# Patient Record
Sex: Female | Born: 1965 | Race: White | Hispanic: No
Health system: Southern US, Community
[De-identification: ages and names within clinical notes are randomized; demographics above are authoritative.]

## PROBLEM LIST (undated history)

## (undated) DIAGNOSIS — S069X9A Unspecified intracranial injury with loss of consciousness of unspecified duration, initial encounter: Secondary | ICD-10-CM

## (undated) DIAGNOSIS — S069XAA Unspecified intracranial injury with loss of consciousness status unknown, initial encounter: Secondary | ICD-10-CM

---

## 2019-09-07 ENCOUNTER — Other Ambulatory Visit: Payer: Self-pay | Admitting: Registered Nurse

## 2019-09-07 DIAGNOSIS — Z780 Asymptomatic menopausal state: Secondary | ICD-10-CM

## 2019-09-07 DIAGNOSIS — Z1231 Encounter for screening mammogram for malignant neoplasm of breast: Secondary | ICD-10-CM

## 2019-09-23 ENCOUNTER — Encounter (HOSPITAL_COMMUNITY): Payer: Self-pay | Admitting: Emergency Medicine

## 2019-09-23 ENCOUNTER — Other Ambulatory Visit: Payer: Self-pay

## 2019-09-23 ENCOUNTER — Emergency Department (HOSPITAL_COMMUNITY): Payer: Medicare Other

## 2019-09-23 ENCOUNTER — Emergency Department (HOSPITAL_COMMUNITY)
Admission: EM | Admit: 2019-09-23 | Discharge: 2019-09-23 | Disposition: A | Discharge status: Home / Self Care | Payer: Medicare Other | Attending: Emergency Medicine | Admitting: Emergency Medicine

## 2019-09-23 DIAGNOSIS — R569 Unspecified convulsions: Secondary | ICD-10-CM | POA: Insufficient documentation

## 2019-09-23 DIAGNOSIS — R410 Disorientation, unspecified: Secondary | ICD-10-CM | POA: Diagnosis not present

## 2019-09-23 HISTORY — DX: Unspecified intracranial injury with loss of consciousness status unknown, initial encounter: S06.9XAA

## 2019-09-23 HISTORY — DX: Unspecified intracranial injury with loss of consciousness of unspecified duration, initial encounter: S06.9X9A

## 2019-09-23 LAB — URINALYSIS, ROUTINE W REFLEX MICROSCOPIC
Bacteria, UA: NONE SEEN
Bilirubin Urine: NEGATIVE
Glucose, UA: NEGATIVE mg/dL
Ketones, ur: NEGATIVE mg/dL
Leukocytes,Ua: NEGATIVE
Nitrite: NEGATIVE
Protein, ur: NEGATIVE mg/dL
Specific Gravity, Urine: 1.009 (ref 1.005–1.030)
pH: 6 (ref 5.0–8.0)

## 2019-09-23 LAB — COMPREHENSIVE METABOLIC PANEL
ALT: 11 U/L (ref 0–44)
AST: 26 U/L (ref 15–41)
Albumin: 4 g/dL (ref 3.5–5.0)
Alkaline Phosphatase: 67 U/L (ref 38–126)
Anion gap: 11 (ref 5–15)
BUN: 7 mg/dL (ref 6–20)
CO2: 24 mmol/L (ref 22–32)
Calcium: 9.4 mg/dL (ref 8.9–10.3)
Chloride: 103 mmol/L (ref 98–111)
Creatinine, Ser: 0.74 mg/dL (ref 0.44–1.00)
GFR calc Af Amer: >60 mL/min (ref >60)
GFR calc non Af Amer: >60 mL/min (ref >60)
Glucose, Bld: 102 mg/dL — ABNORMAL HIGH (ref 70–99)
Potassium: 4.1 mmol/L (ref 3.5–5.1)
Sodium: 138 mmol/L (ref 135–145)
Total Bilirubin: 0.8 mg/dL (ref 0.3–1.2)
Total Protein: 7.5 g/dL (ref 6.5–8.1)

## 2019-09-23 LAB — CBC WITH DIFFERENTIAL/PLATELET
Abs Immature Granulocytes: 0.01 10*3/uL (ref 0.00–0.07)
Basophils Absolute: 0.1 10*3/uL (ref 0.0–0.1)
Basophils Relative: 1 %
Eosinophils Absolute: 0.3 10*3/uL (ref 0.0–0.5)
Eosinophils Relative: 4 %
HCT: 44.3 % (ref 36.0–46.0)
Hemoglobin: 14.5 g/dL (ref 12.0–15.0)
Immature Granulocytes: 0 %
Lymphocytes Relative: 28 %
Lymphs Abs: 2 10*3/uL (ref 0.7–4.0)
MCH: 29.6 pg (ref 26.0–34.0)
MCHC: 32.7 g/dL (ref 30.0–36.0)
MCV: 90.4 fL (ref 80.0–100.0)
Monocytes Absolute: 0.4 10*3/uL (ref 0.1–1.0)
Monocytes Relative: 6 %
Neutro Abs: 4.2 10*3/uL (ref 1.7–7.7)
Neutrophils Relative %: 61 %
Platelets: 350 10*3/uL (ref 150–400)
RBC: 4.9 MIL/uL (ref 3.87–5.11)
RDW: 12.1 % (ref 11.5–15.5)
WBC: 6.9 10*3/uL (ref 4.0–10.5)
nRBC: 0 % (ref 0.0–0.2)

## 2019-09-23 LAB — CBG MONITORING, ED: Glucose-Capillary: 95 mg/dL (ref 70–99)

## 2019-09-23 LAB — I-STAT BETA HCG BLOOD, ED (MC, WL, AP ONLY): I-stat hCG, quantitative: <5 m[IU]/mL (ref <5)

## 2019-09-23 LAB — ETHANOL: Alcohol, Ethyl (B): <10 mg/dL (ref <10)

## 2019-09-23 LAB — RAPID URINE DRUG SCREEN, HOSP PERFORMED
Amphetamines: NOT DETECTED
Barbiturates: NOT DETECTED
Benzodiazepines: NOT DETECTED
Cocaine: NOT DETECTED
Opiates: NOT DETECTED
Tetrahydrocannabinol: NOT DETECTED

## 2019-09-23 LAB — MAGNESIUM: Magnesium: 2.2 mg/dL (ref 1.7–2.4)

## 2019-09-23 MED ORDER — LEVETIRACETAM 500 MG PO TABS: 500.0000 mg | ORAL_TABLET | Freq: Two times a day (BID) | ORAL | 0 refills | Status: DC

## 2019-09-23 MED ORDER — LEVETIRACETAM 500 MG PO TABS
500.0000 mg | ORAL_TABLET | Freq: Once | ORAL | Status: AC
  Administered 2019-09-23: 500 mg via ORAL
  Filled 2019-09-23: qty 1

## 2019-09-23 NOTE — ED Triage Notes (Signed)
Pt here from home via [facility] for witnessed seizure that lasted approx 1 min w/ full body shaking, pt fell. 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 Pt is noncompliant with meds. Per EMS pt was combative at scene but has calmed down w/ talking. VSS.

## 2019-09-23 NOTE — ED Provider Notes (Signed)
[facility] EMERGENCY DEPARTMENT Provider Note   CSN: [id number] Arrival date & time: 09/23/19  1810     History Chief Complaint  Patient presents with  . Seizures    Susan Chen is a 54 y.o. female.  The history is provided by the EMS personnel. No language interpreter was used.   Susan Chen is a 54 y.o. female who presents to the Emergency Department complaining of seizure. Level V caveat due to confusion. History is provided by EMS. She presents the emergency department by EMS from home for evaluation following witness seizure that lasted about one minute. EMS reports that she was postictal and combative on their arrival. She did have urinary incontinence. She has a history of TBI and seizure disorder with three seizures in the last year. She is not currently on any medications due to medication noncompliance. She did have a fall associated with the seizure and struck her head.    Past Medical History:  Diagnosis Date  . TBI (traumatic brain injury) (HCC)     There are no problems to display for this patient.   History reviewed. No pertinent surgical history.   OB History   No obstetric history on file.     History reviewed. No pertinent family history.  Social History   Tobacco Use  . Smoking status: Not on file  Substance Use Topics  . Alcohol use: Not on file  . Drug use: Not on file    Home Medications Prior to Admission medications   Medication Sig Start Date End Date Taking? Authorizing Provider  levETIRAcetam (KEPPRA) 500 MG tablet Take 1 tablet (500 mg total) by mouth 2 (two) times daily. 09/23/19   Tilden Fossa, MD    Allergies    Penicillins and Sulfamethoxazole  Review of Systems   Review of Systems  All other systems reviewed and are negative.   Physical Exam Updated Vital Signs BP 138/69   Pulse 62   Temp 97.8 F (36.6 C) (Oral)   Resp 13   SpO2 100%   Physical Exam Vitals and nursing note reviewed.    Constitutional:      Appearance: She is well-developed.  HENT:     Head: Normocephalic and atraumatic.  Cardiovascular:     Rate and Rhythm: Normal rate and regular rhythm.  Pulmonary:     Effort: Pulmonary effort is normal. No respiratory distress.  Abdominal:     Palpations: Abdomen is soft.     Tenderness: There is no abdominal tenderness. There is no guarding or rebound.  Musculoskeletal:        General: No tenderness.  Skin:    General: Skin is warm and dry.  Neurological:     Mental Status: She is alert.     Comments: Confused. Minimally verbal. Sometimes answers yes/no questions but not consistently. Moves all extremities symmetrically and strongly.  Psychiatric:     Comments: Unable to assess     ED Results / Procedures / Treatments   Labs (all labs ordered are listed, but only abnormal results are displayed) Labs Reviewed  COMPREHENSIVE METABOLIC PANEL - Abnormal; Notable for the following components:      Result Value   Glucose, Bld 102 (*)    All other components within normal limits  URINALYSIS, ROUTINE W REFLEX MICROSCOPIC - Abnormal; Notable for the following components:   Hgb urine dipstick SMALL (*)    All other components within normal limits  ETHANOL  CBC WITH DIFFERENTIAL/PLATELET  RAPID URINE DRUG  SCREEN, HOSP PERFORMED  MAGNESIUM  I-STAT BETA HCG BLOOD, ED ([facility], [facility], [facility] ONLY)  CBG MONITORING, ED    EKG EKG Interpretation  Date/Time:  Wednesday September 23 2019 18:11:47 EDT Ventricular Rate:  96 PR Interval:    QRS Duration: 84 QT Interval:  385 QTC Calculation: 487 R Axis:   38 Text Interpretation: Sinus rhythm Prolonged PR interval Borderline prolonged QT interval No previous ECGs available Confirmed by Tilden Fossa [phone]) on 09/23/2019 6:12:40 PM   Radiology CT Head Wo Contrast  Result Date: 09/23/2019 CLINICAL DATA:  54 year old female with trauma. EXAM: CT HEAD WITHOUT CONTRAST CT CERVICAL SPINE WITHOUT CONTRAST TECHNIQUE:  Multidetector CT imaging of the head and cervical spine was performed following the standard protocol without intravenous contrast. Multiplanar CT image reconstructions of the cervical spine were also generated. COMPARISON:  None. FINDINGS: CT HEAD FINDINGS Brain: Mild age-related atrophy and chronic microvascular ischemic changes. There is no acute intracranial hemorrhage. No mass effect or midline shift. No extra-axial fluid collection. Vascular: No hyperdense vessel or unexpected calcification. Skull: Normal. Negative for fracture or focal lesion. Sinuses/Orbits: No acute finding. Other: None CT CERVICAL SPINE FINDINGS Alignment: No acute subluxation. Skull base and vertebrae: No acute fracture. Subcentimeter hypodense lesion in C6, indeterminate, likely a benign process such as a bone cyst. Soft tissues and spinal canal: No prevertebral fluid or swelling. No visible canal hematoma. Disc levels:  No acute findings.  Mild degenerative changes. Upper chest: Emphysema. Biapical subpleural scarring. Several nodular density in the right apical subpleural region measuring up to 8 mm, likely scarring. Follow-up with chest CT in 3 months recommended. Other: None IMPRESSION: 1. No acute intracranial pathology. Mild age-related atrophy and chronic microvascular ischemic changes. 2. No acute/traumatic cervical spine pathology. 3. Emphysema (ICD10-J43.9). Electronically Signed   By: Elgie Collard M.D.   On: 09/23/2019 19:59   CT Cervical Spine Wo Contrast  Result Date: 09/23/2019 CLINICAL DATA:  54 year old female with trauma. EXAM: CT HEAD WITHOUT CONTRAST CT CERVICAL SPINE WITHOUT CONTRAST TECHNIQUE: Multidetector CT imaging of the head and cervical spine was performed following the standard protocol without intravenous contrast. Multiplanar CT image reconstructions of the cervical spine were also generated. COMPARISON:  None. FINDINGS: CT HEAD FINDINGS Brain: Mild age-related atrophy and chronic microvascular  ischemic changes. There is no acute intracranial hemorrhage. No mass effect or midline shift. No extra-axial fluid collection. Vascular: No hyperdense vessel or unexpected calcification. Skull: Normal. Negative for fracture or focal lesion. Sinuses/Orbits: No acute finding. Other: None CT CERVICAL SPINE FINDINGS Alignment: No acute subluxation. Skull base and vertebrae: No acute fracture. Subcentimeter hypodense lesion in C6, indeterminate, likely a benign process such as a bone cyst. Soft tissues and spinal canal: No prevertebral fluid or swelling. No visible canal hematoma. Disc levels:  No acute findings.  Mild degenerative changes. Upper chest: Emphysema. Biapical subpleural scarring. Several nodular density in the right apical subpleural region measuring up to 8 mm, likely scarring. Follow-up with chest CT in 3 months recommended. Other: None IMPRESSION: 1. No acute intracranial pathology. Mild age-related atrophy and chronic microvascular ischemic changes. 2. No acute/traumatic cervical spine pathology. 3. Emphysema (ICD10-J43.9). Electronically Signed   By: Elgie Collard M.D.   On: 09/23/2019 19:59    Procedures Procedures (including critical care time)  Medications Ordered in ED Medications  levETIRAcetam (KEPPRA) tablet 500 mg (500 mg Oral Given 09/23/19 2247)    ED Course  I have reviewed the triage vital signs and the nursing notes.  Pertinent labs &  imaging results that were available during my care of the patient were reviewed by me and considered in my medical decision making (see chart for details).    MDM Rules/Calculators/A&P                         Patient with history of TBI and seizure disorder here for evaluation following seizure event. Patient initially postictal on evaluation. On repeat assessment she is awake and alert with no acute complaints. Labs without significant electrolyte abnormality. CT scans with no acute abnormality. Discussed with patient and husband home  care for seizure. Given this is a third event of seizure recommend starting seizure medications. Discussed importance of neurology follow-up as well seizure precautions such as not driving. Return precautions discussed.  Final Clinical Impression(s) / ED Diagnoses Final diagnoses:  Seizure (HCC)    Rx / DC Orders ED Discharge Orders         Ordered    Ambulatory referral to Neurology  Status:  Canceled     Reprint    Comments: An appointment is requested in approximately: 2 weeks   09/23/19 2212    Ambulatory referral to Neurology     Discontinue  Reprint    Comments: An appointment is requested in approximately: 2 weeks seizures   09/23/19 2232    levETIRAcetam (KEPPRA) 500 MG tablet  2 times daily     Discontinue  Reprint     09/23/19 2232           Tilden Fossa, MD 09/23/19 2327

## 2019-09-23 NOTE — ED Notes (Signed)
Pt's caregiver verbalized understanding of discharge instructions. Follow up care and prescriptions reviewed. Pt brought to lobby via wheelchair w/ caregiver.

## 2019-10-06 ENCOUNTER — Encounter: Payer: Self-pay | Admitting: Neurology

## 2019-10-11 ENCOUNTER — Emergency Department (HOSPITAL_COMMUNITY)
Admission: EM | Admit: 2019-10-11 | Discharge: 2019-10-11 | Disposition: A | Discharge status: Home / Self Care | Payer: Medicare Other | Attending: Emergency Medicine | Admitting: Emergency Medicine

## 2019-10-11 ENCOUNTER — Encounter (HOSPITAL_COMMUNITY): Payer: Self-pay

## 2019-10-11 DIAGNOSIS — R4182 Altered mental status, unspecified: Secondary | ICD-10-CM | POA: Diagnosis not present

## 2019-10-11 DIAGNOSIS — G40909 Epilepsy, unspecified, not intractable, without status epilepticus: Secondary | ICD-10-CM | POA: Insufficient documentation

## 2019-10-11 MED ORDER — LEVETIRACETAM 500 MG PO TABS: 500.0000 mg | ORAL_TABLET | Freq: Two times a day (BID) | ORAL | 3 refills | Status: AC

## 2019-10-11 MED ORDER — LEVETIRACETAM IN NACL 1000 MG/100ML IV SOLN
1000.0000 mg | Freq: Once | INTRAVENOUS | Status: AC
  Administered 2019-10-11: 1000 mg via INTRAVENOUS
  Filled 2019-10-11: qty 100

## 2019-10-11 NOTE — ED Notes (Addendum)
Assume care from EMS per EMS pt had a witness seizure by husband lasted for about 7-10 mins, During post ictal Phase ems reports pt became combative and physical restraints were used and 5mg  of midazolam was given by EMS. Ems Reports Pt is nonverbal with an hx of TBI about 8 years ago ans started having frequent seizure, Pt is non complaint on Keppra. Pt is place on the monitor x 3, VSS at this time, Seizure precautions are place

## 2019-10-11 NOTE — ED Provider Notes (Signed)
[facility] EMERGENCY DEPARTMENT Provider Note   CSN: [id number] Arrival date & time: 10/11/19  1647     History Chief Complaint  Patient presents with  . Seizures    Susan Chen is a 54 y.o. female.  HPI   54 y/o female - hx of TBI and seizures, not taking medicines, she has had w/u in the past, CT done on 09/23/19, was neg for acute findings - she was Rx Keppra but has not filled it.  She had 2 witnessed seizures today, when paramedics were called the patient was postictal and starting to become a little bit agitated and combative. n Level 5 caveat applies due to altered MS.  Had received 5mg  of Versed by EMS for agitation pre hospital.  Per sig other - "almost non verbal".  Last seizure was around 2 weeks ago.  Past Medical History:  Diagnosis Date  . TBI (traumatic brain injury) (HCC)     There are no problems to display for this patient.   History reviewed. No pertinent surgical history.   OB History   No obstetric history on file.     History reviewed. No pertinent family history.  Social History   Tobacco Use  . Smoking status: Not on file  Substance Use Topics  . Alcohol use: Not on file  . Drug use: Not on file    Home Medications Prior to Admission medications   Medication Sig Start Date End Date Taking? Authorizing Provider  levETIRAcetam (KEPPRA) 500 MG tablet Take 1 tablet (500 mg total) by mouth 2 (two) times daily. 10/11/19   12/11/19, MD    Allergies    Penicillins and Sulfamethoxazole  Review of Systems   Review of Systems  Unable to perform ROS: Mental status change    Physical Exam Updated Vital Signs BP 102/70 (BP Location: Right Arm)   Pulse 89   Temp 98.7 F (37.1 C) (Oral)   Resp 19   Ht 1.626 m (5\' 4" )   Wt 45.7 kg   SpO2 96%   BMI 17.29 kg/m   Physical Exam Vitals and nursing note reviewed.  Constitutional:      General: She is not in acute distress.    Appearance: She is well-developed.    HENT:     Head: Normocephalic and atraumatic.     Mouth/Throat:     Pharynx: No oropharyngeal exudate.  Eyes:     General: No scleral icterus.       Right eye: No discharge.        Left eye: No discharge.     Conjunctiva/sclera: Conjunctivae normal.     Pupils: Pupils are equal, round, and reactive to light.  Neck:     Thyroid: No thyromegaly.     Vascular: No JVD.  Cardiovascular:     Rate and Rhythm: Normal rate and regular rhythm.     Heart sounds: Normal heart sounds. No murmur heard.  No friction rub. No gallop.   Pulmonary:     Effort: Pulmonary effort is normal. No respiratory distress.     Breath sounds: Normal breath sounds. No wheezing or rales.  Abdominal:     General: Bowel sounds are normal. There is no distension.     Palpations: Abdomen is soft. There is no mass.     Tenderness: There is no abdominal tenderness.  Musculoskeletal:        General: No tenderness. Normal range of motion.     Cervical back: Normal range  of motion and neck supple.  Lymphadenopathy:     Cervical: No cervical adenopathy.  Skin:    General: Skin is warm and dry.     Findings: No erythema or rash.  Neurological:     Mental Status: She is alert.     Coordination: Coordination normal.     Comments: Somnolent, seems to be moving all 4 extremities, nonverbal "nonverbal at baseline"  Psychiatric:        Behavior: Behavior normal.     ED Results / Procedures / Treatments   Labs (all labs ordered are listed, but only abnormal results are displayed) Labs Reviewed  BASIC METABOLIC PANEL  CBC    EKG EKG Interpretation  Date/Time:  Sunday October 11 2019 16:55:58 EDT Ventricular Rate:  94 PR Interval:    QRS Duration: 88 QT Interval:  372 QTC Calculation: 466 R Axis:   57 Text Interpretation: Sinus rhythm Consider left ventricular hypertrophy ECG OTHERWISE WITHIN NORMAL LIMITS Confirmed by Clariza Sickman ([id number]) on 10/11/2019 5:28:08 PM   Radiology No results  found.  Procedures Procedures (including critical care time)  Medications Ordered in ED Medications  levETIRAcetam (KEPPRA) IVPB 1000 mg/100 mL premix (1,000 mg Intravenous New Bag/Given 10/11/19 1901)    ED Course  I have reviewed the triage vital signs and the nursing notes.  Pertinent labs & imaging results that were available during my care of the patient were reviewed by me and considered in my medical decision making (see chart for details).    MDM Rules/Calculators/A&P                          This patient seems to be postictal, her vital signs are normal, she has known history of seizures, recent CT scan was unremarkable, I do not see a need to reCT scan her today.  She will need Keppra intravenously, observation until she is back to baseline, discharged to follow-up in the outpatient setting, awaiting family  According to family - believes that she had new onset seizures in August (1 month or so ago).  Final Clinical Impression(s) / ED Diagnoses Final diagnoses:  Seizure disorder (HCC)    Rx / DC Orders ED Discharge Orders         Ordered    levETIRAcetam (KEPPRA) 500 MG tablet  2 times daily        09 /05/21 1900           12-11-1984, MD 10/11/19 1902

## 2019-10-11 NOTE — ED Notes (Signed)
Pt d/c by MD given d/c instructions and follow up care, Pt is out of the ED ambulatory w/ family

## 2019-10-11 NOTE — Discharge Instructions (Signed)
Please take Keppra twice daily for the next 30 days, I have placed 2 refills on this prescription as well so that you can continue to take it until you see the neurologist.  If you need a family doctor please see the list above, but return to the emergency department for severe or worsening symptoms

## 2020-01-26 ENCOUNTER — Ambulatory Visit: Payer: Medicare Other | Admitting: Neurology

## 2020-04-18 ENCOUNTER — Ambulatory Visit: Payer: Medicare Other | Admitting: Neurology

## 2020-07-25 ENCOUNTER — Ambulatory Visit: Payer: Medicare HMO | Admitting: Neurology

## 2022-02-02 IMAGING — CT CT HEAD W/O CM
4 series · 15 of 47 positions shown, 17 images · non-contrast
Comparison: None.

CLINICAL DATA: 54-year-old female with trauma.

EXAM:
CT HEAD WITHOUT CONTRAST
CT CERVICAL SPINE WITHOUT CONTRAST
TECHNIQUE: Multidetector CT imaging of the head and cervical spine was
performed following the standard protocol without intravenous
contrast. Multiplanar CT image reconstructions of the cervical spine
were also generated.

[Series 3: head without · axial · non-contrast · 0.41mm/px · z∈[-99,+11]mm · 7 of 30 slices shown, 9 images]
[im 4/30  brain]
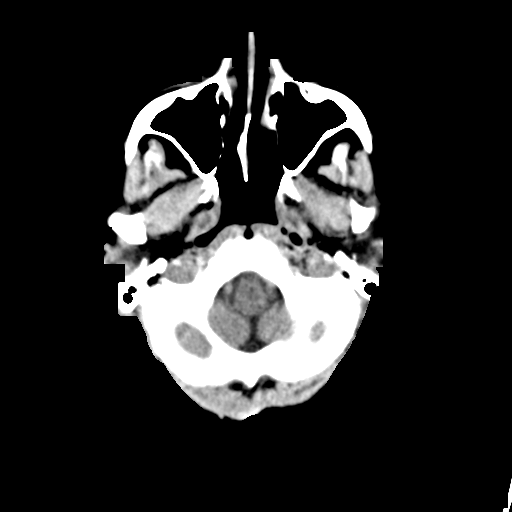
[im 4/30  bone]
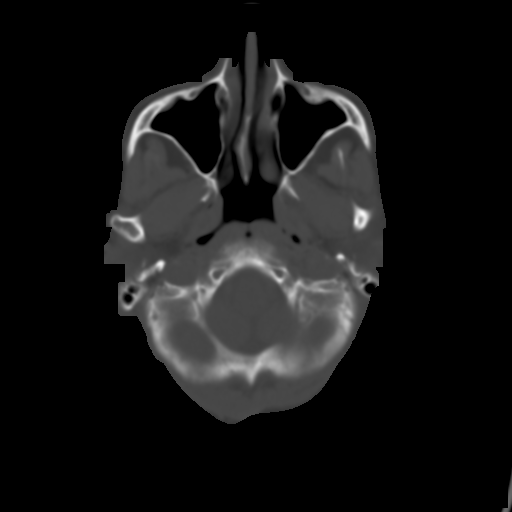
[im 8/30  brain]
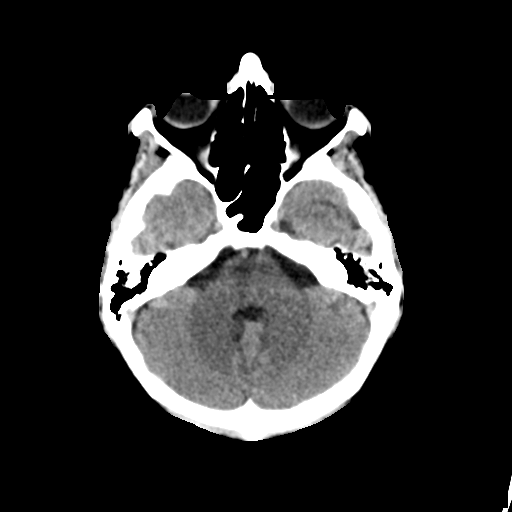
[im 11/30  brain]
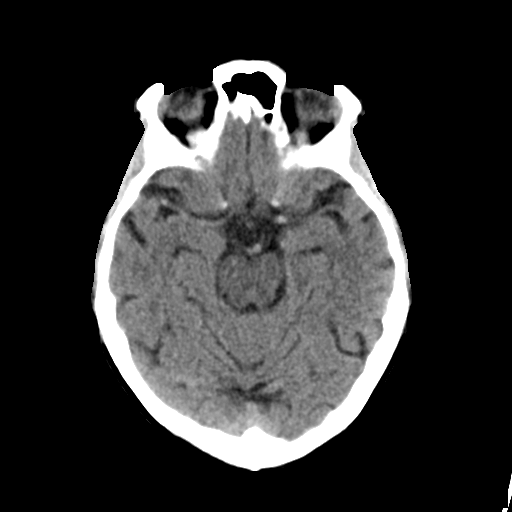
[im 15/30  brain]
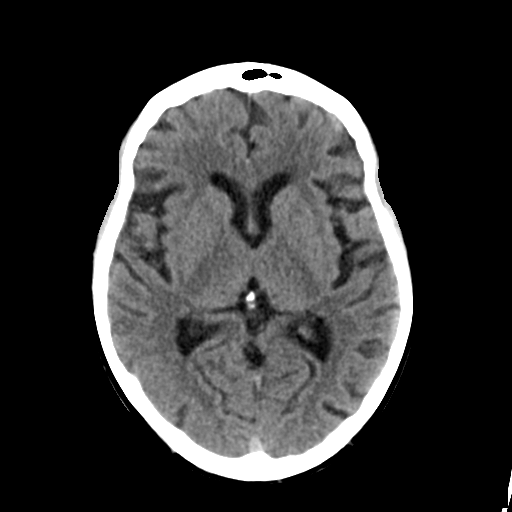
[im 19/30  brain]
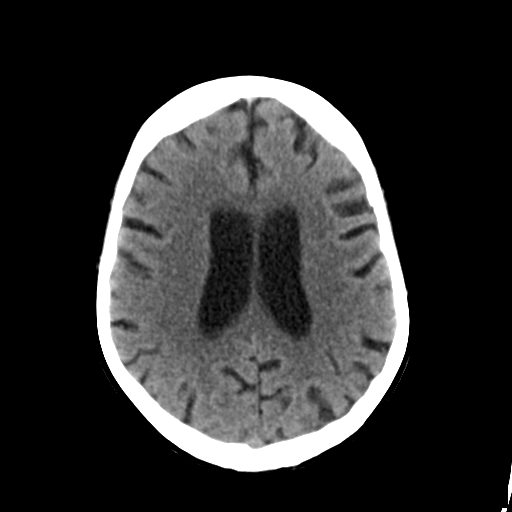
[im 19/30  bone]
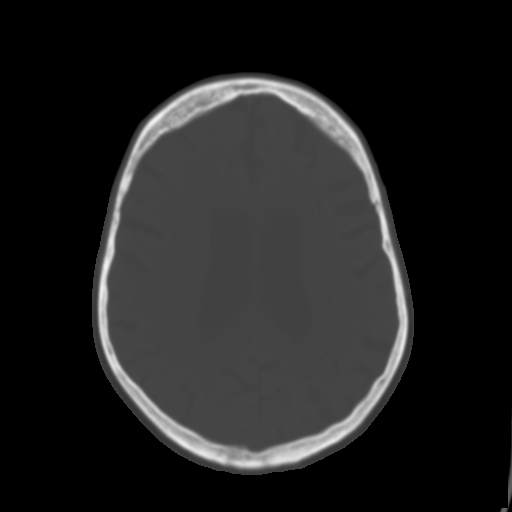
[im 22/30  brain]
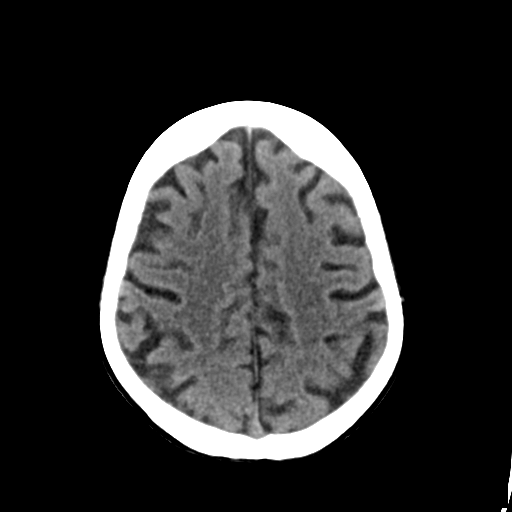
[im 26/30  brain]
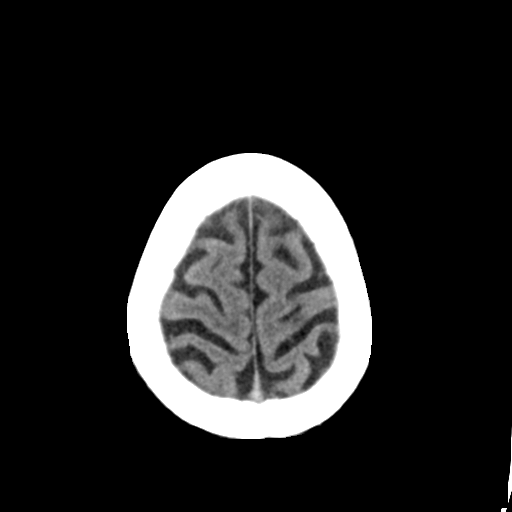

[Series 4: head bone · axial · 0.42mm/px · z∈[-100,-86]mm · 2 of 73 slices shown]
[im 8/73  bone]
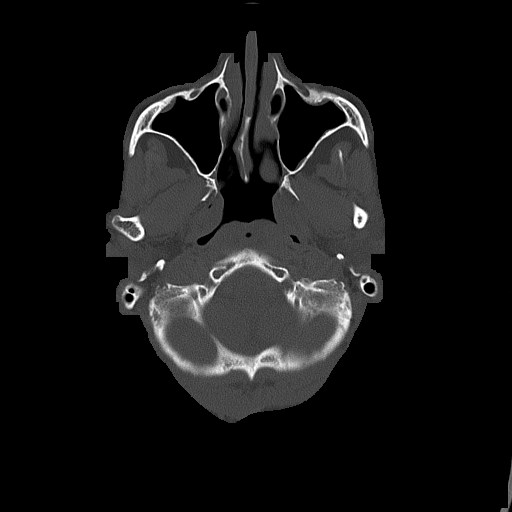
[im 15/73  bone]
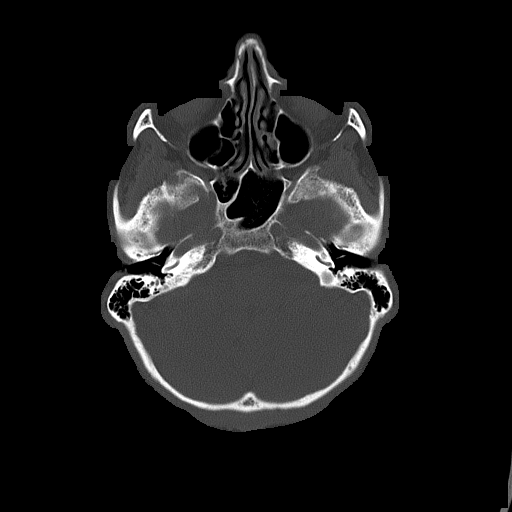

[Series 5: head without cor · coronal · non-contrast · 0.32mm/px · 3 of 69 slices shown]
[im 23/69  brain]
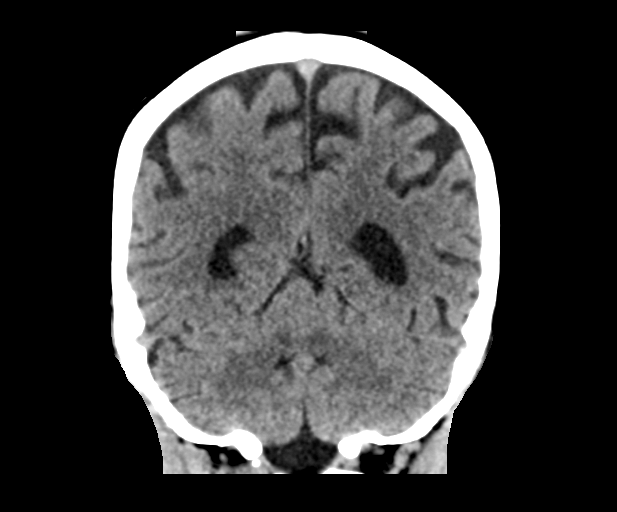
[im 31/69  brain]
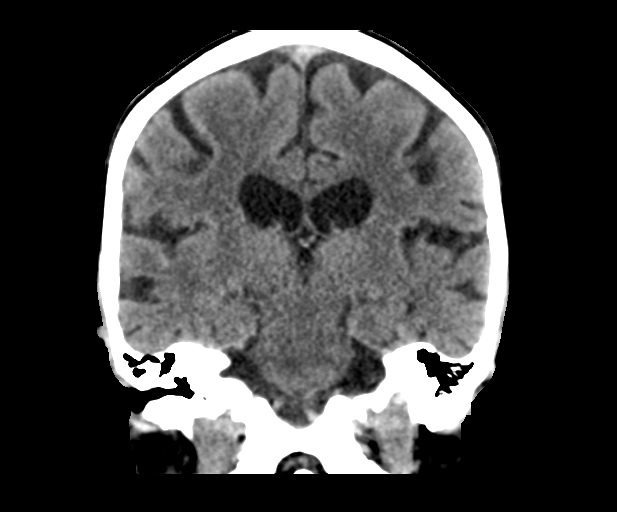
[im 38/69  brain]
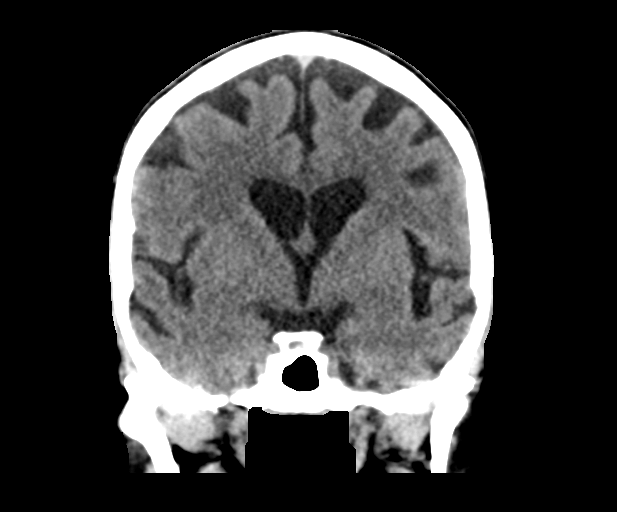

[Series 6: head without sag · sagittal · non-contrast · 0.30mm/px · 3 of 67 slices shown]
[im 23/67  brain]
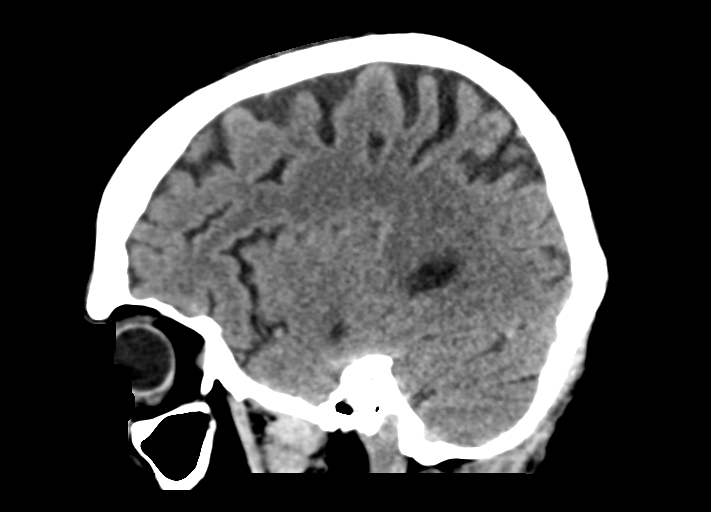
[im 34/67  brain]
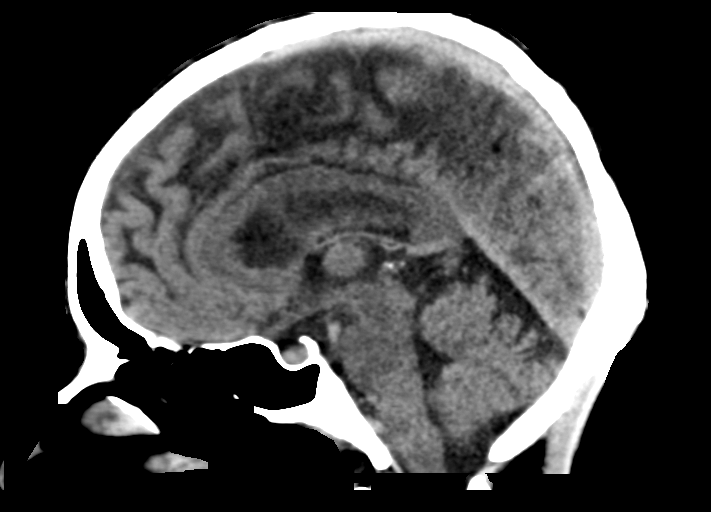
[im 45/67  brain]
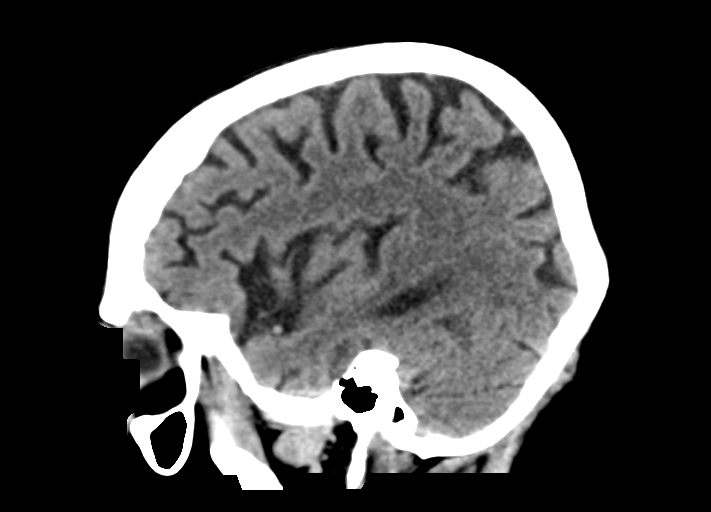

[15 of 47 positions shown; findings below may reference images not displayed]

FINDINGS: CT HEAD FINDINGS

Brain: Mild age-related atrophy and chronic microvascular ischemic
changes. There is no acute intracranial hemorrhage. No mass effect
or midline shift. No extra-axial fluid collection.

Vascular: No hyperdense vessel or unexpected calcification.

Skull: Normal. Negative for fracture or focal lesion.

Sinuses/Orbits: No acute finding.

Other: None

CT CERVICAL SPINE FINDINGS

Alignment: No acute subluxation.

Skull base and vertebrae: No acute fracture. Subcentimeter hypodense
lesion in C6, indeterminate, likely a benign process such as a bone
cyst.

Soft tissues and spinal canal: No prevertebral fluid or swelling. No
visible canal hematoma.

Disc levels:  No acute findings.  Mild degenerative changes.

Upper chest: Emphysema. Biapical subpleural scarring. Several
nodular density in the right apical subpleural region measuring up
to 8 mm, likely scarring. Follow-up with chest CT in 3 months
recommended.

Other: None
IMPRESSION: 1. No acute intracranial pathology. Mild age-related atrophy and
chronic microvascular ischemic changes.
2. No acute/traumatic cervical spine pathology.
3. Emphysema ([id number]).

## 2022-02-02 IMAGING — CT CT CERVICAL SPINE W/O CM
3 of 4 series · 13 of 33 positions shown, 16 images · non-contrast
Comparison: None.

CLINICAL DATA: 54-year-old female with trauma.

EXAM:
CT HEAD WITHOUT CONTRAST
CT CERVICAL SPINE WITHOUT CONTRAST
TECHNIQUE: Multidetector CT imaging of the head and cervical spine was
performed following the standard protocol without intravenous
contrast. Multiplanar CT image reconstructions of the cervical spine
were also generated.

[Series 4: c_spine 2.0 st · axial · 0.32mm/px · z∈[-274,-130]mm · 5 of 108 slices shown, 7 images]
[im 18/108  soft-tissue]
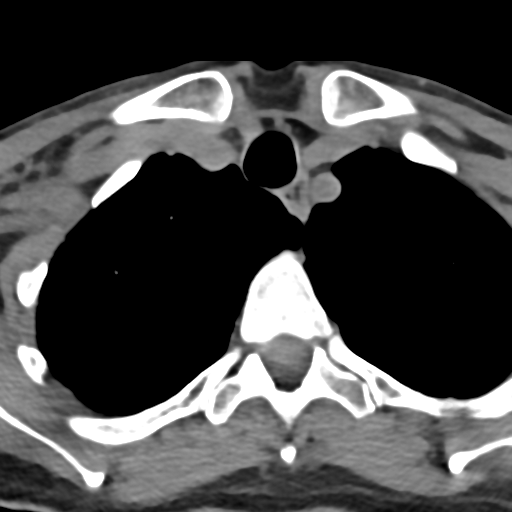
[im 18/108  bone]
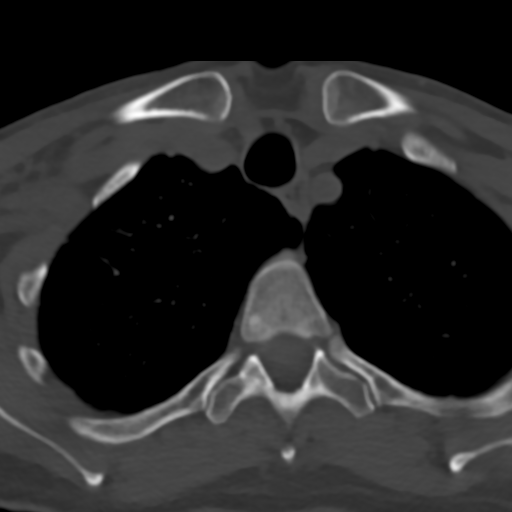
[im 36/108  bone]
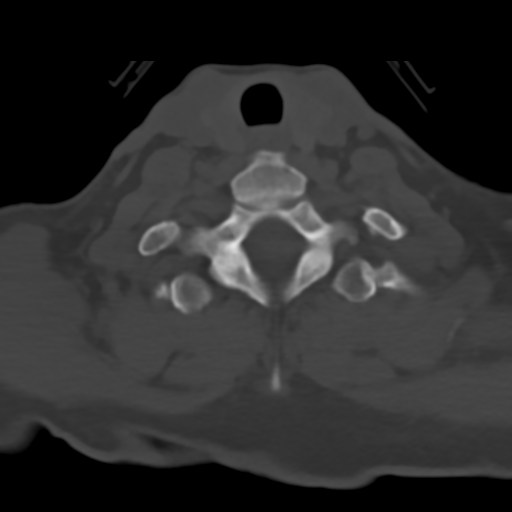
[im 54/108  bone]
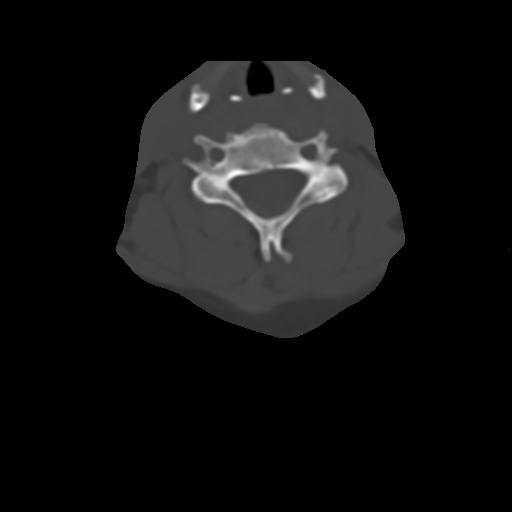
[im 72/108  bone]
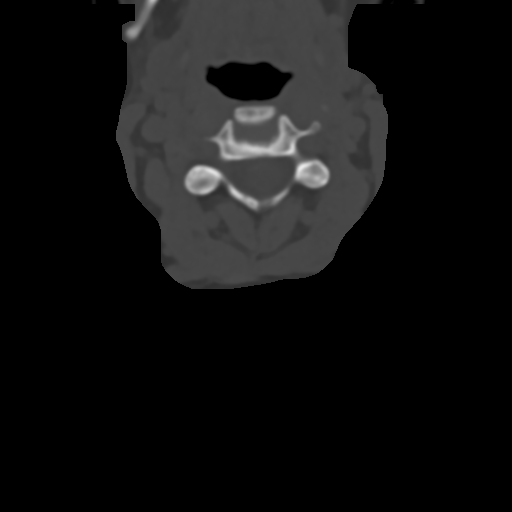
[im 90/108  soft-tissue]
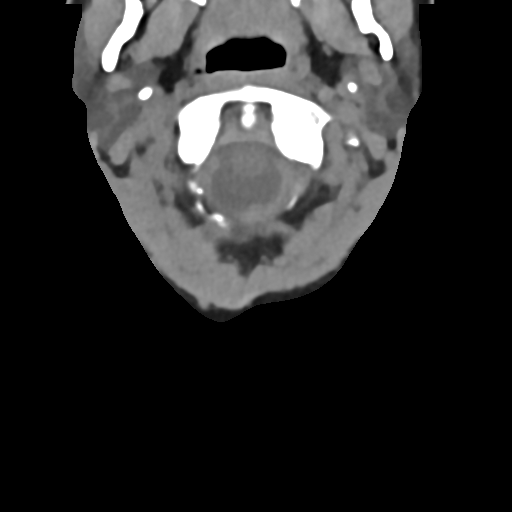
[im 90/108  bone]
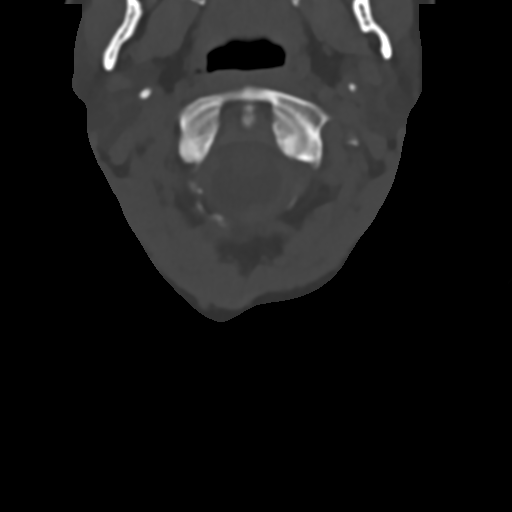

[Series 8: c_spine 2.0 sag bone · sagittal · 0.32mm/px · 5 of 74 slices shown, 6 images]
[im 25/74  bone]
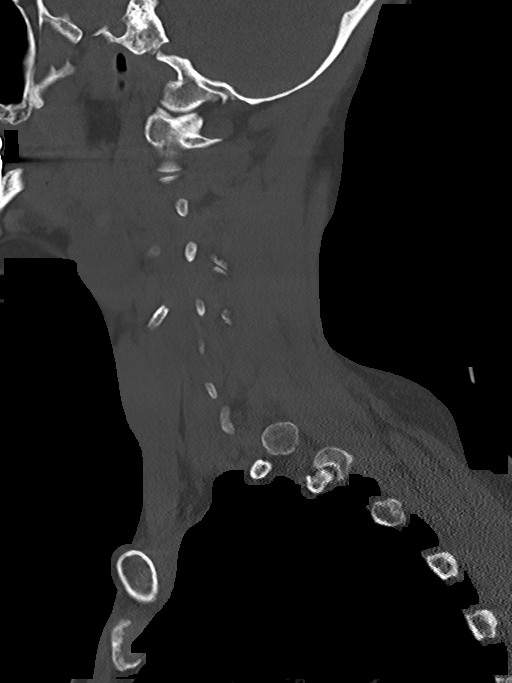
[im 31/74  bone]
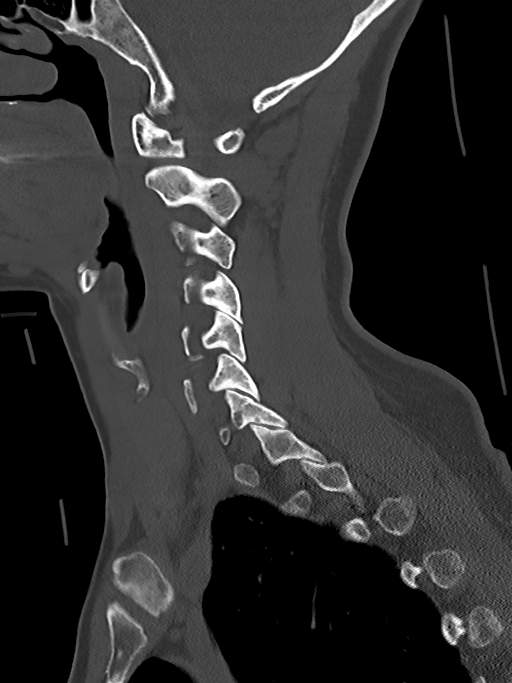
[im 37/74  soft-tissue]
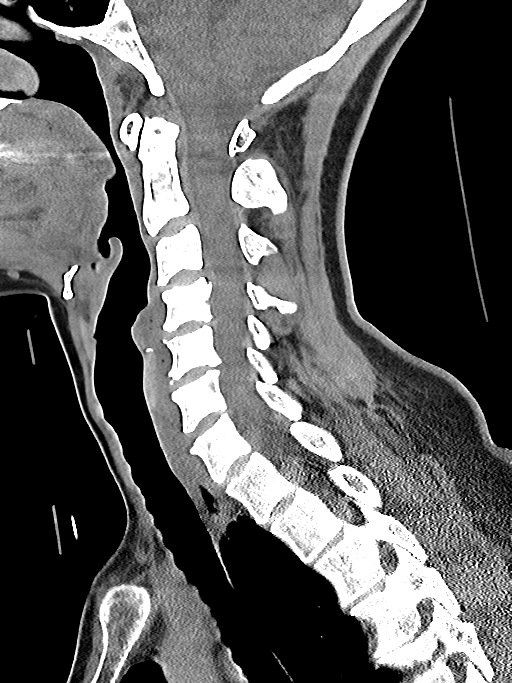
[im 37/74  bone]
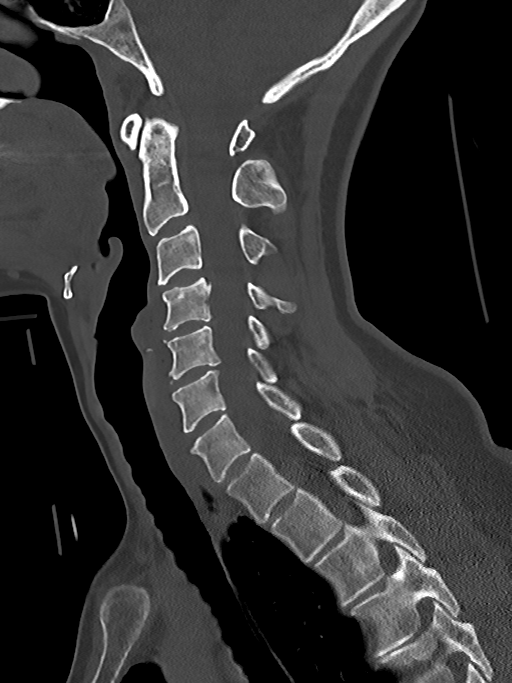
[im 43/74  bone]
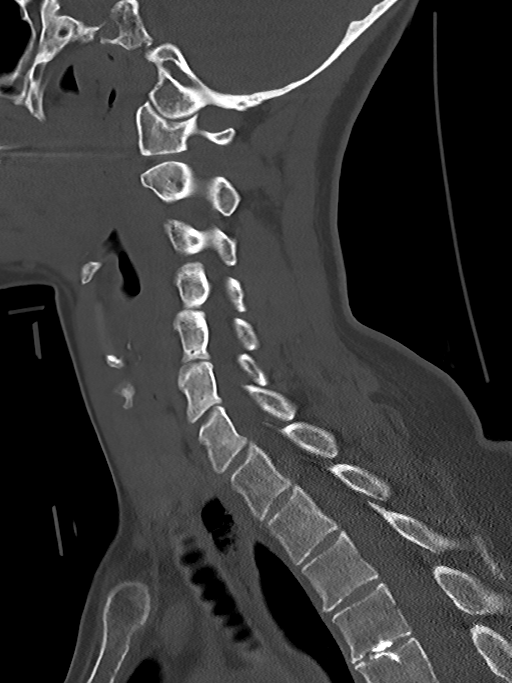
[im 49/74  bone]
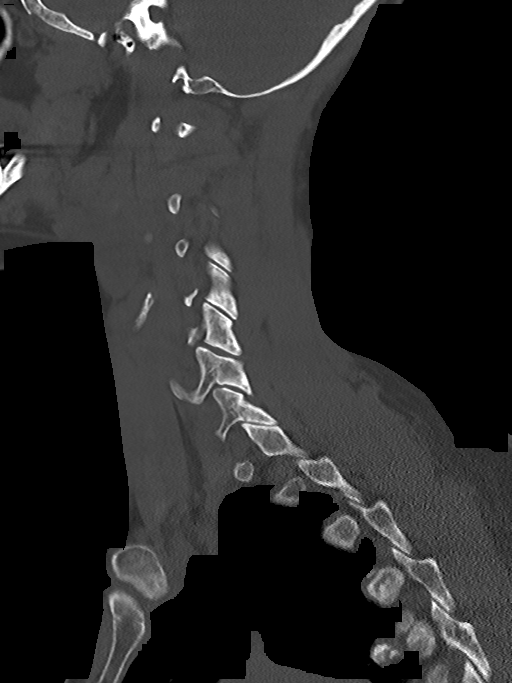

[Series 9: c_spine 2.0 cor bone · coronal · 0.32mm/px · 3 of 86 slices shown]
[im 18/86  bone]
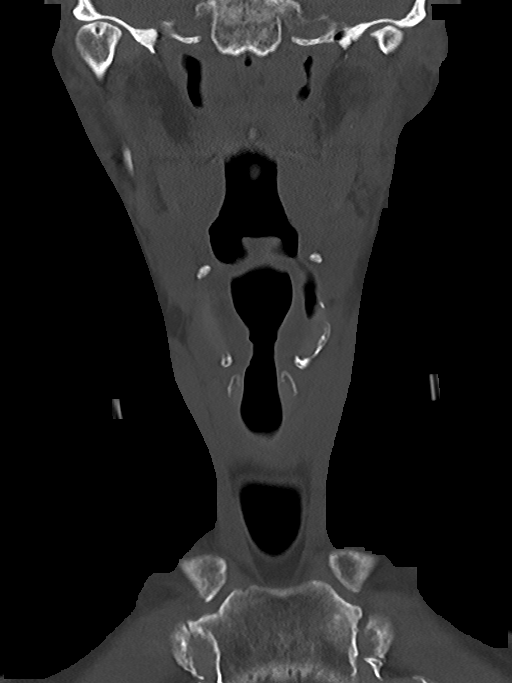
[im 35/86  bone]
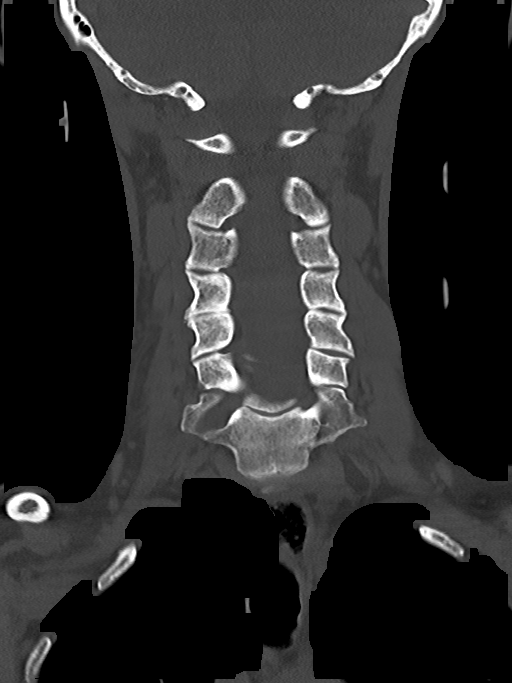
[im 52/86  bone]
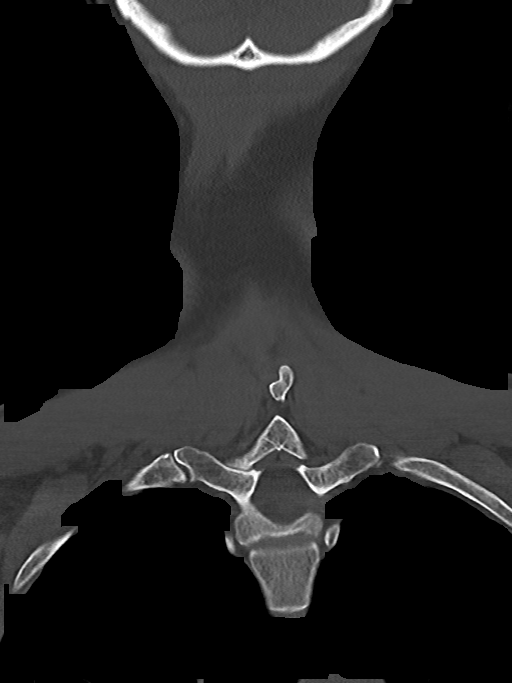

[13 of 33 positions shown; findings below may reference images not displayed]

FINDINGS: CT HEAD FINDINGS

Brain: Mild age-related atrophy and chronic microvascular ischemic
changes. There is no acute intracranial hemorrhage. No mass effect
or midline shift. No extra-axial fluid collection.

Vascular: No hyperdense vessel or unexpected calcification.

Skull: Normal. Negative for fracture or focal lesion.

Sinuses/Orbits: No acute finding.

Other: None

CT CERVICAL SPINE FINDINGS

Alignment: No acute subluxation.

Skull base and vertebrae: No acute fracture. Subcentimeter hypodense
lesion in C6, indeterminate, likely a benign process such as a bone
cyst.

Soft tissues and spinal canal: No prevertebral fluid or swelling. No
visible canal hematoma.

Disc levels:  No acute findings.  Mild degenerative changes.

Upper chest: Emphysema. Biapical subpleural scarring. Several
nodular density in the right apical subpleural region measuring up
to 8 mm, likely scarring. Follow-up with chest CT in 3 months
recommended.

Other: None
IMPRESSION: 1. No acute intracranial pathology. Mild age-related atrophy and
chronic microvascular ischemic changes.
2. No acute/traumatic cervical spine pathology.
3. Emphysema ([id number]).
